# Patient Record
Sex: Female | Born: 1970 | Race: White | Hispanic: No | Marital: Single | Smoking: Never smoker
Health system: Southern US, Community
[De-identification: ages and names within clinical notes are randomized; demographics above are authoritative.]

---

## 2015-09-22 ENCOUNTER — Ambulatory Visit
Admission: EM | Admit: 2015-09-22 | Discharge: 2015-09-22 | Discharge status: Absent without leave | Payer: BC Managed Care – PPO

## 2017-04-09 ENCOUNTER — Other Ambulatory Visit: Payer: Self-pay | Admitting: Pediatrics

## 2017-04-09 DIAGNOSIS — Z1231 Encounter for screening mammogram for malignant neoplasm of breast: Secondary | ICD-10-CM

## 2017-05-01 ENCOUNTER — Encounter: Payer: Self-pay | Admitting: Radiology

## 2017-05-01 ENCOUNTER — Ambulatory Visit
Admission: RE | Admit: 2017-05-01 | Discharge: 2017-05-01 | Disposition: A | Discharge status: Home / Self Care | Payer: BC Managed Care – PPO | Source: Ambulatory Visit | Attending: Pediatrics | Admitting: Pediatrics

## 2017-05-01 DIAGNOSIS — Z1231 Encounter for screening mammogram for malignant neoplasm of breast: Secondary | ICD-10-CM | POA: Diagnosis present

## 2017-05-06 ENCOUNTER — Inpatient Hospital Stay
Admission: RE | Admit: 2017-05-06 | Discharge: 2017-05-06 | Disposition: A | Discharge status: Home / Self Care | Payer: Self-pay | Source: Ambulatory Visit | Attending: *Deleted | Admitting: *Deleted

## 2017-05-06 ENCOUNTER — Other Ambulatory Visit: Payer: Self-pay | Admitting: *Deleted

## 2017-05-06 DIAGNOSIS — Z9289 Personal history of other medical treatment: Secondary | ICD-10-CM

## 2018-06-10 ENCOUNTER — Other Ambulatory Visit: Payer: Self-pay | Admitting: Pediatrics

## 2018-06-10 DIAGNOSIS — Z1231 Encounter for screening mammogram for malignant neoplasm of breast: Secondary | ICD-10-CM

## 2018-07-03 ENCOUNTER — Ambulatory Visit
Admission: RE | Admit: 2018-07-03 | Discharge: 2018-07-03 | Disposition: A | Discharge status: Home / Self Care | Payer: BC Managed Care – PPO | Source: Ambulatory Visit | Attending: Pediatrics | Admitting: Pediatrics

## 2018-07-03 ENCOUNTER — Encounter (INDEPENDENT_AMBULATORY_CARE_PROVIDER_SITE_OTHER): Payer: Self-pay

## 2018-07-03 DIAGNOSIS — Z1231 Encounter for screening mammogram for malignant neoplasm of breast: Secondary | ICD-10-CM | POA: Diagnosis not present

## 2019-05-01 ENCOUNTER — Emergency Department
Admission: EM | Admit: 2019-05-01 | Discharge: 2019-05-01 | Disposition: A | Discharge status: Home / Self Care | Payer: BC Managed Care – PPO | Attending: Emergency Medicine | Admitting: Emergency Medicine

## 2019-05-01 ENCOUNTER — Other Ambulatory Visit: Payer: Self-pay

## 2019-05-01 ENCOUNTER — Emergency Department: Payer: BC Managed Care – PPO

## 2019-05-01 ENCOUNTER — Encounter: Payer: Self-pay | Admitting: Emergency Medicine

## 2019-05-01 DIAGNOSIS — W01198A Fall on same level from slipping, tripping and stumbling with subsequent striking against other object, initial encounter: Secondary | ICD-10-CM | POA: Diagnosis not present

## 2019-05-01 DIAGNOSIS — S0181XA Laceration without foreign body of other part of head, initial encounter: Secondary | ICD-10-CM | POA: Insufficient documentation

## 2019-05-01 DIAGNOSIS — Y998 Other external cause status: Secondary | ICD-10-CM | POA: Insufficient documentation

## 2019-05-01 DIAGNOSIS — Y92018 Other place in single-family (private) house as the place of occurrence of the external cause: Secondary | ICD-10-CM | POA: Insufficient documentation

## 2019-05-01 DIAGNOSIS — S0990XA Unspecified injury of head, initial encounter: Secondary | ICD-10-CM | POA: Diagnosis present

## 2019-05-01 DIAGNOSIS — Y9389 Activity, other specified: Secondary | ICD-10-CM | POA: Insufficient documentation

## 2019-05-01 MED ORDER — LIDOCAINE HCL (PF) 1 % IJ SOLN
INTRAMUSCULAR | Status: AC
  Filled 2019-05-01: qty 5

## 2019-05-01 MED ORDER — CEPHALEXIN 500 MG PO CAPS: 500.0000 mg | ORAL_CAPSULE | Freq: Three times a day (TID) | ORAL | 0 refills | Status: AC

## 2019-05-01 MED ORDER — LIDOCAINE HCL 1 % IJ SOLN: 5.0000 mL | Freq: Once | INTRAMUSCULAR | Status: DC

## 2019-05-01 NOTE — ED Triage Notes (Signed)
Patient ambulatory to triage with complaints of left lower chin laceration that occurred at approx 2000 today.  Pt first presented at urgent care and "they said it was too deep and sent me here" area is bandaged and bleeding controlled.  Pt denies taking medications at time before coming to ED.  And reports last tetanus shot over 5 years prior.    Speaking in complete coherent sentences. No acute breathing distress noted.

## 2019-05-01 NOTE — ED Provider Notes (Signed)
[facility] Department Provider Note  ____________________________________________  Time seen: Approximately 11:09 PM  I have reviewed the triage vital signs and the nursing notes.   HISTORY  Chief Complaint Facial Laceration    HPI Sara Guzman is a 48 y.o. female presents to the emergency department with a 4 cm laceration along the chin.  Patient reports that she was readying her home to have guests when she tripped and fell against a metal ornament on her porch.  She denies losing consciousness.  She denies blurry vision, nausea, disorientation or confusion.  She has been able to ambulate since incident occurred.  No numbness or tingling in the upper and lower extremities.  No chest pain, chest tightness, shortness of breath or abdominal pain. She denies neck pain. No alleviating measures were attempted prior to presenting to the emergency department.         History reviewed. No pertinent past medical history.  There are no active problems to display for this patient.   Past Surgical History:  Procedure Laterality Date  . CESAREAN SECTION      Prior to Admission medications   Not on File    Allergies Patient has no known allergies.  Family History  Problem Relation Age of Onset  . Breast cancer Neg Hx     Social History Social History   Tobacco Use  . Smoking status: Never Smoker  . Smokeless tobacco: Never Used  Substance Use Topics  . Alcohol use: Yes    Frequency: Never  . Drug use: Never     Review of Systems  Constitutional: No fever/chills Eyes: No visual changes. No discharge ENT: No upper respiratory complaints. Cardiovascular: no chest pain. Respiratory: no cough. No SOB. Gastrointestinal: No abdominal pain.  No nausea, no vomiting.  No diarrhea.  No constipation. Musculoskeletal: Negative for musculoskeletal pain. Skin: Patient has facial laceration.  Neurological: Negative for headaches, focal weakness  or numbness.   ____________________________________________   PHYSICAL EXAM:  VITAL SIGNS: ED Triage Vitals [05/01/19 2039]  Enc Vitals Group     BP 136/85     Pulse Rate 74     Resp 18     Temp 98.7 F (37.1 C)     Temp Source Oral     SpO2 100 %     Weight 150 lb (68 kg)     Height  (1.651 m)     Head Circumference      Peak Flow      Pain Score 0     Pain Loc      Pain Edu?      Excl. in GC?      Constitutional: Alert and oriented. Well appearing and in no acute distress. Eyes: Conjunctivae are normal. PERRL. EOMI. Head: Atraumatic. ENT:      Nose: No congestion/rhinnorhea.      Mouth/Throat: Mucous membranes are moist.  Neck: No stridor.  No cervical spine tenderness to palpation. Cardiovascular: Normal rate, regular rhythm. Normal S1 and S2.  Good peripheral circulation. Respiratory: Normal respiratory effort without tachypnea or retractions. Lungs CTAB. Good air entry to the bases with no decreased or absent breath sounds. Gastrointestinal: Bowel sounds 4 quadrants. Soft and nontender to palpation. No guarding or rigidity. No palpable masses. No distention. No CVA tenderness. Musculoskeletal: Full range of motion to all extremities. No gross deformities appreciated. Neurologic:  Normal speech and language. No gross focal neurologic deficits are appreciated.  Skin: Patient has 4 cm, linear laceration along  chin approximately 1 cm deep. Psychiatric: Mood and affect are normal. Speech and behavior are normal. Patient exhibits appropriate insight and judgement.   ____________________________________________   LABS (all labs ordered are listed, but only abnormal results are displayed)  Labs Reviewed - No data to display ____________________________________________  EKG   ____________________________________________  RADIOLOGY I personally viewed and evaluated these images as part of my medical decision making, as well as reviewing the written report  by the radiologist.  Ct Head Wo Contrast  Result Date: 05/01/2019 CLINICAL DATA:  Fall EXAM: CT HEAD WITHOUT CONTRAST CT MAXILLOFACIAL WITHOUT CONTRAST TECHNIQUE: Multidetector CT imaging of the head and maxillofacial structures were performed using the standard protocol without intravenous contrast. Multiplanar CT image reconstructions of the maxillofacial structures were also generated. COMPARISON:  None. FINDINGS: CT HEAD FINDINGS Brain: No evidence of acute infarction, hemorrhage, hydrocephalus, extra-axial collection or mass lesion/mass effect. Vascular: No hyperdense vessel or unexpected calcification. Skull: Normal. Negative for fracture or focal lesion. Other: Incidental note of low-lying cerebellar tonsils, extending approximately 5 mm below the foramen magnum. CT MAXILLOFACIAL FINDINGS Osseous: No fracture or mandibular dislocation. No destructive process. Orbits: Negative. No traumatic or inflammatory finding. Sinuses: Clear. Soft tissues: Soft tissue laceration of the left lip and chin. IMPRESSION: 1.  No acute intracranial pathology. 2. Incidental note of low-lying cerebellar tonsils, extending approximately 5 mm below the foramen magnum. 3.  No displaced fracture or dislocation of the facial bones. 4.  Soft tissue laceration of the left lip and chin. Electronically Signed   By: Lauralyn PrimesAlex  Bibbey M.D.   On: 05/01/2019 21:27   Ct Maxillofacial Wo Contrast  Result Date: 05/01/2019 CLINICAL DATA:  Fall EXAM: CT HEAD WITHOUT CONTRAST CT MAXILLOFACIAL WITHOUT CONTRAST TECHNIQUE: Multidetector CT imaging of the head and maxillofacial structures were performed using the standard protocol without intravenous contrast. Multiplanar CT image reconstructions of the maxillofacial structures were also generated. COMPARISON:  None. FINDINGS: CT HEAD FINDINGS Brain: No evidence of acute infarction, hemorrhage, hydrocephalus, extra-axial collection or mass lesion/mass effect. Vascular: No hyperdense vessel or  unexpected calcification. Skull: Normal. Negative for fracture or focal lesion. Other: Incidental note of low-lying cerebellar tonsils, extending approximately 5 mm below the foramen magnum. CT MAXILLOFACIAL FINDINGS Osseous: No fracture or mandibular dislocation. No destructive process. Orbits: Negative. No traumatic or inflammatory finding. Sinuses: Clear. Soft tissues: Soft tissue laceration of the left lip and chin. IMPRESSION: 1.  No acute intracranial pathology. 2. Incidental note of low-lying cerebellar tonsils, extending approximately 5 mm below the foramen magnum. 3.  No displaced fracture or dislocation of the facial bones. 4.  Soft tissue laceration of the left lip and chin. Electronically Signed   By: Lauralyn PrimesAlex  Bibbey M.D.   On: 05/01/2019 21:27    ____________________________________________    PROCEDURES  Procedure(s) performed:    Marland Kitchen.Marland Kitchen.Laceration Repair  Date/Time: 05/01/2019 11:13 PM Performed by: Orvil FeilWoods, Brandonn Capelli M, PA-C Authorized by: Orvil FeilWoods, Dempsey Ahonen M, PA-C   Consent:    Consent obtained:  Verbal   Consent given by:  Patient   Risks discussed:  Infection, pain, retained foreign body, poor cosmetic result and poor wound healing Anesthesia (see MAR for exact dosages):    Anesthesia method:  Local infiltration   Local anesthetic:  Lidocaine 1% w/o epi Laceration details:    Location:  Face   Face location:  Chin   Length (cm):  4   Depth (mm):  10 Repair type:    Repair type:  Simple Pre-procedure details:    Preparation:  Patient was prepped and draped in usual sterile fashion Exploration:    Hemostasis achieved with:  Direct pressure   Wound exploration: entire depth of wound probed and visualized     Contaminated: no   Treatment:    Area cleansed with:  Saline and Betadine   Amount of cleaning:  Extensive   Irrigation solution:  Sterile saline   Irrigation volume:  20 cc   Irrigation method:  Syringe   Visualized foreign bodies/material removed: yes   Skin repair:     Repair method:  Sutures   Suture size:  6-0   Suture technique:  Simple interrupted   Number of sutures:  11 Approximation:    Approximation:  Close Post-procedure details:    Dressing:  Sterile dressing   Patient tolerance of procedure:  Tolerated well, no immediate complications  .Marland KitchenLaceration Repair  Date/Time: 05/01/2019 11:23 PM Performed by: Orvil Feil, PA-C Authorized by: Orvil Feil, PA-C   Consent:    Consent obtained:  Verbal   Consent given by:  Patient   Risks discussed:  Infection, pain, retained foreign body, poor cosmetic result and poor wound healing Anesthesia (see MAR for exact dosages):    Anesthesia method:  Local infiltration   Local anesthetic:  Lidocaine 1% w/o epi Laceration details:    Location:  Face   Face location:  Chin   Length (cm):  4   Depth (mm):  10 Repair type:    Repair type:  Simple Exploration:    Hemostasis achieved with:  Direct pressure   Wound exploration: entire depth of wound probed and visualized     Contaminated: no   Treatment:    Area cleansed with:  Saline   Amount of cleaning:  Extensive   Irrigation solution:  Sterile saline   Visualized foreign bodies/material removed: no   Skin repair:    Repair method:  Sutures   Suture size:  5-0   Suture material:  Fast-absorbing gut   Suture technique:  Subcuticular   Number of sutures:  6 Approximation:    Approximation:  Close Post-procedure details:    Dressing:  Sterile dressing   Patient tolerance of procedure:  Tolerated well, no immediate complications      Medications  lidocaine (XYLOCAINE) 1 % (with pres) injection 5 mL (has no administration in time range)  lidocaine (PF) (XYLOCAINE) 1 % injection (has no administration in time range)     ____________________________________________   INITIAL IMPRESSION / ASSESSMENT AND PLAN / ED COURSE  Pertinent labs & imaging results that were available during my care of the patient were reviewed by me  and considered in my medical decision making (see chart for details).  Review of the [place] CSRS was performed in accordance of the [facility] prior to dispensing any controlled drugs.           Assessment and Plan: Facial laceration 48 year old female presents to the emergency department with a 4 cm laceration at chin.  Vital signs were reassuring at triage.  Neuro exam was appropriate for age.  CTs of the head and face revealed no evidence of intracranial bleed, skull fracture or facial fracture.  Laceration repair occurred in the emergency department without complication.  Patient was advised to have external sutures removed by primary care in 5 days.  She was discharged with Keflex.  She declined prescriptions for pain and discharge.  All patient questions were answered.    ____________________________________________  FINAL CLINICAL IMPRESSION(S) / ED DIAGNOSES  Final diagnoses:  Facial laceration, initial encounter      NEW MEDICATIONS STARTED DURING THIS VISIT:  ED Discharge Orders    None          This chart was dictated using voice recognition software/Dragon. Despite best efforts to proofread, errors can occur which can change the meaning. Any change was purely unintentional.    Lannie Fields, PA-C 05/01/19 2325    Vanessa Allisonia, MD 05/03/19 [phone]

## 2019-05-01 NOTE — ED Notes (Signed)
Lip lac appears as avulsion, measuring 3 cm in length

## 2020-11-17 ENCOUNTER — Other Ambulatory Visit: Payer: Self-pay | Admitting: Family

## 2020-11-17 DIAGNOSIS — Z1231 Encounter for screening mammogram for malignant neoplasm of breast: Secondary | ICD-10-CM

## 2020-12-07 ENCOUNTER — Other Ambulatory Visit: Payer: Self-pay

## 2020-12-07 ENCOUNTER — Ambulatory Visit
Admission: RE | Admit: 2020-12-07 | Discharge: 2020-12-07 | Disposition: A | Discharge status: Home / Self Care | Payer: BC Managed Care – PPO | Source: Ambulatory Visit | Attending: Family | Admitting: Family

## 2020-12-07 DIAGNOSIS — Z1231 Encounter for screening mammogram for malignant neoplasm of breast: Secondary | ICD-10-CM | POA: Diagnosis not present

## 2021-02-09 ENCOUNTER — Encounter: Payer: Self-pay | Admitting: Emergency Medicine

## 2021-02-09 ENCOUNTER — Other Ambulatory Visit: Payer: Self-pay

## 2021-02-09 ENCOUNTER — Emergency Department: Payer: BC Managed Care – PPO

## 2021-02-09 ENCOUNTER — Emergency Department
Admission: EM | Admit: 2021-02-09 | Discharge: 2021-02-09 | Disposition: A | Discharge status: Home / Self Care | Payer: BC Managed Care – PPO | Attending: Emergency Medicine | Admitting: Emergency Medicine

## 2021-02-09 DIAGNOSIS — R82998 Other abnormal findings in urine: Secondary | ICD-10-CM | POA: Diagnosis not present

## 2021-02-09 DIAGNOSIS — R55 Syncope and collapse: Secondary | ICD-10-CM

## 2021-02-09 DIAGNOSIS — S0990XA Unspecified injury of head, initial encounter: Secondary | ICD-10-CM | POA: Diagnosis present

## 2021-02-09 DIAGNOSIS — S0181XA Laceration without foreign body of other part of head, initial encounter: Secondary | ICD-10-CM | POA: Diagnosis not present

## 2021-02-09 DIAGNOSIS — W01198A Fall on same level from slipping, tripping and stumbling with subsequent striking against other object, initial encounter: Secondary | ICD-10-CM | POA: Insufficient documentation

## 2021-02-09 LAB — BASIC METABOLIC PANEL
Anion gap: 9 (ref 5–15)
BUN: 14 mg/dL (ref 6–20)
CO2: 23 mmol/L (ref 22–32)
Calcium: 8.3 mg/dL — ABNORMAL LOW (ref 8.9–10.3)
Chloride: 105 mmol/L (ref 98–111)
Creatinine, Ser: 0.9 mg/dL (ref 0.44–1.00)
GFR, Estimated: >60 mL/min (ref >60)
Glucose, Bld: 96 mg/dL (ref 70–99)
Potassium: 4.1 mmol/L (ref 3.5–5.1)
Sodium: 137 mmol/L (ref 135–145)

## 2021-02-09 LAB — CBC
HCT: 40.9 % (ref 36.0–46.0)
Hemoglobin: 13.8 g/dL (ref 12.0–15.0)
MCH: 31.2 pg (ref 26.0–34.0)
MCHC: 33.7 g/dL (ref 30.0–36.0)
MCV: 92.5 fL (ref 80.0–100.0)
Platelets: 343 10*3/uL (ref 150–400)
RBC: 4.42 MIL/uL (ref 3.87–5.11)
RDW: 13.4 % (ref 11.5–15.5)
WBC: 7 10*3/uL (ref 4.0–10.5)
nRBC: 0 % (ref 0.0–0.2)

## 2021-02-09 LAB — URINALYSIS, COMPLETE (UACMP) WITH MICROSCOPIC
Bilirubin Urine: NEGATIVE
Glucose, UA: NEGATIVE mg/dL
Ketones, ur: NEGATIVE mg/dL
Leukocytes,Ua: NEGATIVE
Nitrite: NEGATIVE
Protein, ur: NEGATIVE mg/dL
Specific Gravity, Urine: 1.023 (ref 1.005–1.030)
pH: 6 (ref 5.0–8.0)

## 2021-02-09 LAB — POC URINE PREG, ED: Preg Test, Ur: NEGATIVE

## 2021-02-09 NOTE — ED Notes (Signed)
Pt presents to ED with complaints of head and positive LOC last night after dinner. Pt reports headache while making dinner then was watching movie when she began to feel nauseous, then stood up that resulted in passing out. Pt reports hitting head and landing on knees. Pt at this time states that she feels fine, besides feeling a little "whoosy". Pt at this time in no apparent distress.

## 2021-02-09 NOTE — Discharge Instructions (Addendum)
Get help right away if: You develop severe swelling around your wound. You have pus or a bad smell coming from your wound. Your pain is severe and suddenly gets worse. You develop painful lumps near your wound or anywhere on your body. You have a red streak going away from your wound.

## 2021-02-09 NOTE — ED Triage Notes (Signed)
Pt comes into the ED via POV c/o syncopal episode yesterday where she fell and hit her head.  Pt states that she had a headache all day yesterday and finally was feeling better so she got up to cook dinner.  She felt like she was going to have an episode of emesis when she stood, and then she doesn't remember anything after that.  Pt does present with small laceration to the forehead.  Pt denies any blood thinner use.  Pt currently neurologically intact and is ambulatory to triage.

## 2021-02-09 NOTE — ED Provider Notes (Signed)
[facility] Emergency Department Provider Note   ____________________________________________   Event Date/Time   First MD Initiated Contact with Patient 02/09/21 1155     (approximate)  I have reviewed the triage vital signs and the nursing notes.   HISTORY  Chief Complaint Loss of Consciousness    HPI Sara Guzman is a 50 y.o. female reports no major past medical history.  She reports yesterday she mowed the lawn, then in the evening she was sitting on the couch and she started to feel little bit nauseated.  She stood up and when she did she passed out.  She reports she fell forward, and struck her left forehead on a weight that she uses for working out that was on the ground.  It bled a fair amount but the bleeding did stop and she has a small cut over the left forehead.  She is up-to-date on her tetanus shot.  It is already started to heal over  She reports a mild headache.  She does report a history of frequent sinus headaches, usually associated with doing things like allergies or mowing the lawn and reports that the headache is not unusual but defect that she passed out is.  She does have a history of low blood pressure but has never passed out before  There is no associated chest pain no trouble breathing.  No racing heart.  She is been in her normal health.  Not pregnant.  Event occurred last evening around 10 PM.  She called her doctor's office this morning and they referred her to the ER for further evaluation.  She reports she is been doing peripherally fine this morning without any concerns or difficulties.  Just reports a very mild headache.  She has not any fevers no neck pain.  No other injuries.  She was able to get herself up off the floor clean up the floor, and rest last night.  History reviewed. No pertinent past medical history.  There are no problems to display for this patient.   Past Surgical History:  Procedure Laterality  Date   CESAREAN SECTION      Prior to Admission medications   Not on File    Allergies Patient has no known allergies.  Family History  Problem Relation Age of Onset   Breast cancer Neg Hx     Social History Social History   Tobacco Use   Smoking status: Never   Smokeless tobacco: Never  Substance Use Topics   Alcohol use: Yes   Drug use: Never    Review of Systems Constitutional: Feels fine now, no recent illnesses.   Eyes: No visual changes. ENT:  No neck pain Cardiovascular: Denies chest pain. Respiratory: Denies shortness of breath. Gastrointestinal: No abdominal pain.  Felt nauseated right before she stood up and passed out but that has resolved.  No vomiting. Genitourinary: Negative for dysuria. Musculoskeletal: Negative for back pain. Skin: Negative for rash. Neurological: Negative for areas of focal weakness or numbness.  Takes no blood thinners.  No history of bleeding disorder.  ____________________________________________   PHYSICAL EXAM:  VITAL SIGNS: ED Triage Vitals  Enc Vitals Group     BP 02/09/21 0921 114/75     Pulse Rate 02/09/21 0921 63     Resp 02/09/21 0921 18     Temp 02/09/21 0921 98.2 F (36.8 C)     Temp Source 02/09/21 0921 Oral     SpO2 02/09/21 0921 99 %  Weight 02/09/21 0920 149 lb 14.6 oz (68 kg)     Height 02/09/21 0920 5\' 5"  (1.651 m)     Head Circumference --      Peak Flow --      Pain Score 02/09/21 0920 0     Pain Loc --      Pain Edu? --      Excl. in GC? --     Constitutional: Alert and oriented. Well appearing and in no acute distress.  Ambulatory from her room back and forth to the physician station without difficulty.  She is very pleasant.  Normal gait. Eyes: Conjunctivae are normal. Head: Atraumatic except for a very small left frontal hematoma with minimal swelling, and centrally about a 1 cm well approximated laceration that has now closed by secondary intention.  There is no surrounding erythema  drainage or evidence of infection. Nose: No congestion/rhinnorhea. Mouth/Throat: Mucous membranes are moist. Neck: No stridor.  No cervical or thoracic tenderness. Cardiovascular: Normal rate, regular rhythm. Grossly normal heart sounds.  Good peripheral circulation. Respiratory: Normal respiratory effort.  No retractions. Lungs CTAB. Gastrointestinal: Soft and nontender. No distention. Musculoskeletal: No lower extremity tenderness nor edema. Neurologic:  Normal speech and language. No gross focal neurologic deficits are appreciated.  Normal extraocular movements.  No pronator drift in any extremity.  5 out of 5 strength in all extremities.  No ataxia.  Normal facial smile and clear speech. Skin:  Skin is warm, dry and intact. No rash noted. Psychiatric: Mood and affect are normal. Speech and behavior are normal.  ____________________________________________   LABS (all labs ordered are listed, but only abnormal results are displayed)  Labs Reviewed  BASIC METABOLIC PANEL - Abnormal; Notable for the following components:      Result Value   Calcium 8.3 (*)    All other components within normal limits  URINALYSIS, COMPLETE (UACMP) WITH MICROSCOPIC - Abnormal; Notable for the following components:   Color, Urine YELLOW (*)    APPearance HAZY (*)    Hgb urine dipstick SMALL (*)    Bacteria, UA RARE (*)    All other components within normal limits  URINE CULTURE  CBC  POC URINE PREG, ED   ____________________________________________  EKG  Reviewed inter by me at 920 Heart rate 55 QRS 80 QTc 400 Sinus bradycardia, otherwise normal ____________________________________________  RADIOLOGY  CT HEAD WO CONTRAST (04/11/21)  Result Date: 02/09/2021 CLINICAL DATA:  Fall striking left side of the forehead yesterday. Nausea. Headache. EXAM: CT HEAD WITHOUT CONTRAST TECHNIQUE: Contiguous axial images were obtained from the base of the skull through the vertex without intravenous contrast.  COMPARISON:  05/01/2019 FINDINGS: Brain: The brainstem, cerebellum, cerebral peduncles, thalami, basal ganglia, basilar cisterns, and ventricular system appear within normal limits. No intracranial hemorrhage, mass lesion, or acute CVA. Vascular: Unremarkable Skull: Unremarkable Sinuses/Orbits: Chronic left sphenoid sinusitis. Other: Mild soft tissue swelling/scalp hematoma along the left forehead, image 6 series 2. IMPRESSION: 1. Mild soft tissue swelling/scalp hematoma along the left forehead. 2. No acute intracranial abnormality. 3. Chronic left sphenoid sinusitis. Electronically Signed   By: 05/03/2019 M.D.   On: 02/09/2021 13:12     Discussed imaging including findings with the patient ____________________________________________   PROCEDURES  Procedure(s) performed: None  Procedures  Critical Care performed: No  ____________________________________________   INITIAL IMPRESSION / ASSESSMENT AND PLAN / ED COURSE  Pertinent labs & imaging results that were available during my care of the patient were reviewed by me and considered in  my medical decision making (see chart for details).   Patient has episode of syncope.  Also associates a mild headache but reports that to be typical and not unusual.  She did cut the grass yesterday, and then in the evening felt slightly nauseated and when she stood up became lightheaded and passed out.  No obvious preceding symptoms to suggest acute cardiac pulmonary vascular or other etiology.  She is resting comfortably now with normal hemodynamics, event occurred last night.  Ambulatory without distress.  Small cut over the left forehead which will be allowed to heal by secondary intent as it is already closed.  Discussed signs and symptoms of potential infection to monitor for.  Patient up-to-date on tetanus.  Resting comfortably without distress.  Reassuring insuring labs EKG CBC and metabolic panel all without acute abnormalities  noted  ----------------------------------------- 1:34 PM on 02/09/2021 ----------------------------------------- Head CT performed to evaluate and exclude intracranial injury, unlikely subarachnoid hemorrhage etc.  Return precautions and treatment recommendations and follow-up discussed with the patient who is agreeable with the plan.   Patient able to ambulate out of the ER without difficulty.  Very pleasant.      ____________________________________________   FINAL CLINICAL IMPRESSION(S) / ED DIAGNOSES  Final diagnoses:  Syncope and collapse  Forehead laceration, initial encounter        Note:  This document was prepared using Dragon voice recognition software and may include unintentional dictation errors       Sharyn Creamer, MD 02/09/21 1334

## 2021-02-10 LAB — URINE CULTURE

## 2022-05-14 IMAGING — MG MM DIGITAL SCREENING BILAT W/ TOMO AND CAD
8 series · 9 of 24 positions shown · non-contrast
Comparison: Previous exam(s).

CLINICAL DATA: Screening.

EXAM:
DIGITAL SCREENING BILATERAL MAMMOGRAM WITH TOMOSYNTHESIS AND CAD
TECHNIQUE: Bilateral screening digital craniocaudal and mediolateral oblique
mammograms were obtained. Bilateral screening digital breast
tomosynthesis was performed. The images were evaluated with
computer-aided detection.

[L CC synth-2D]
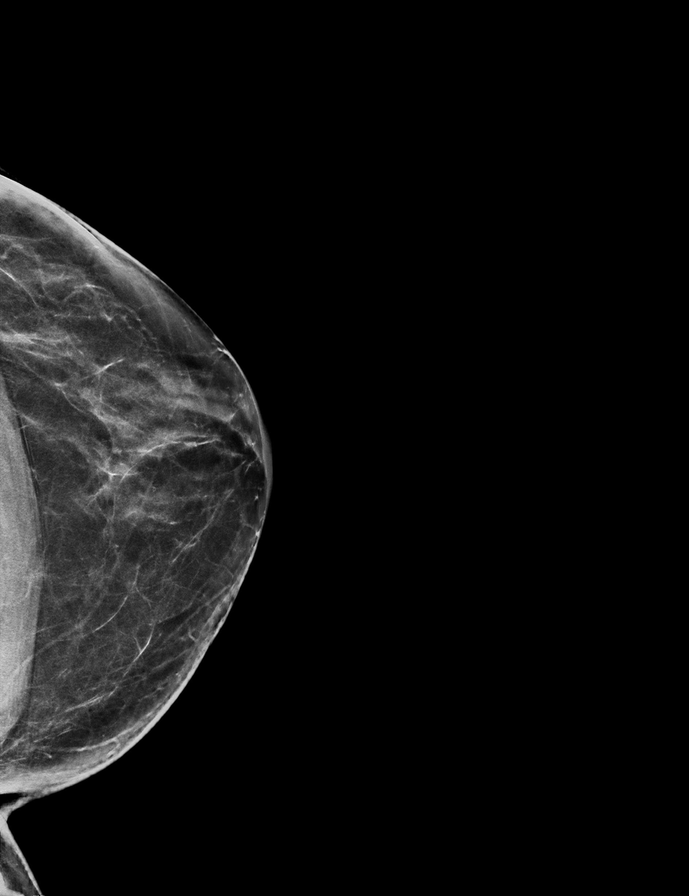

[R MLO synth-2D]
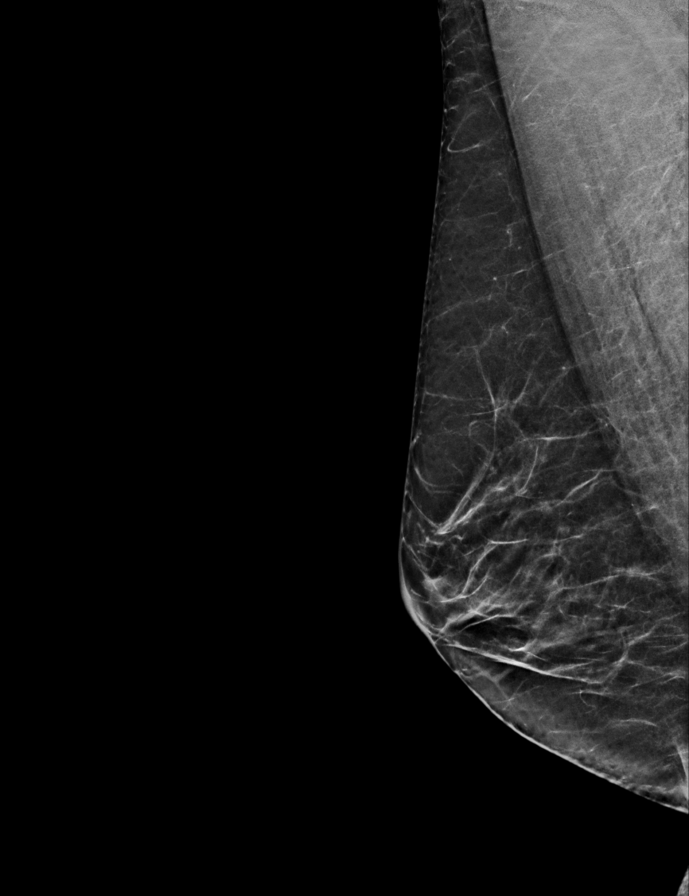

[R CC synth-2D]
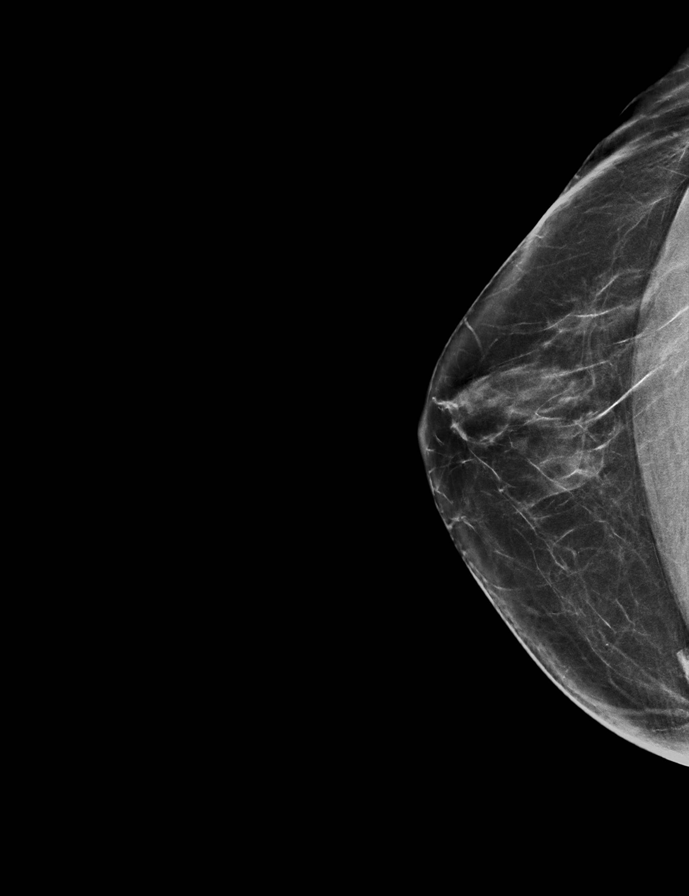

[L MLO synth-2D]
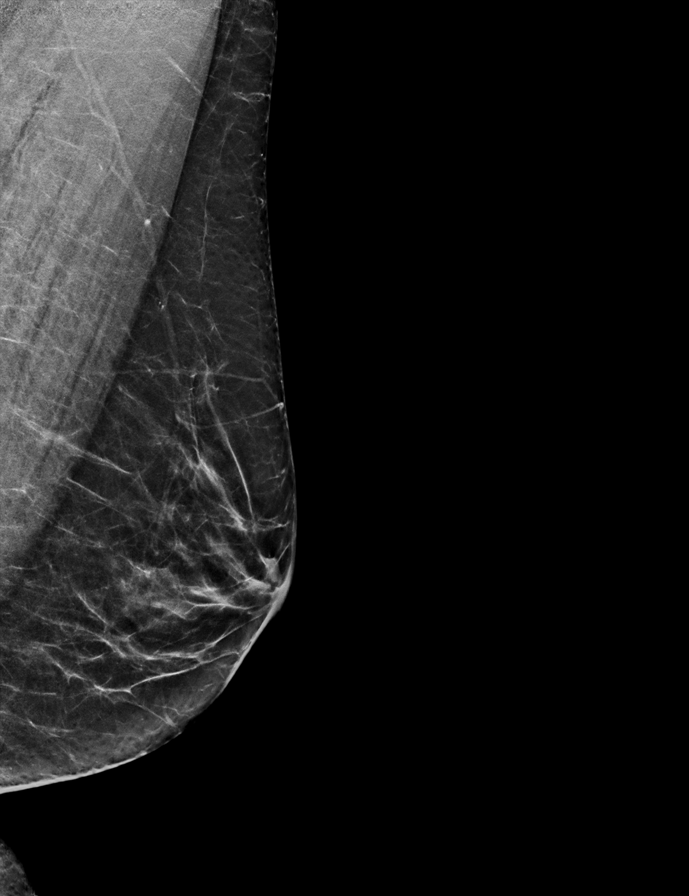

[R MLO tomo · 2 of 52 frames shown]
[frame 17/52]
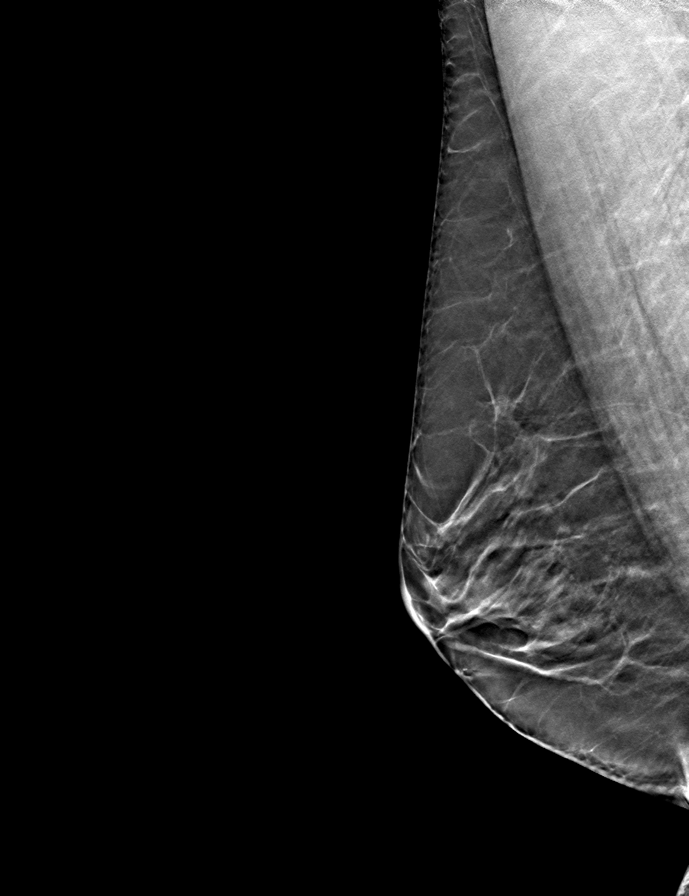
[frame 27/52]
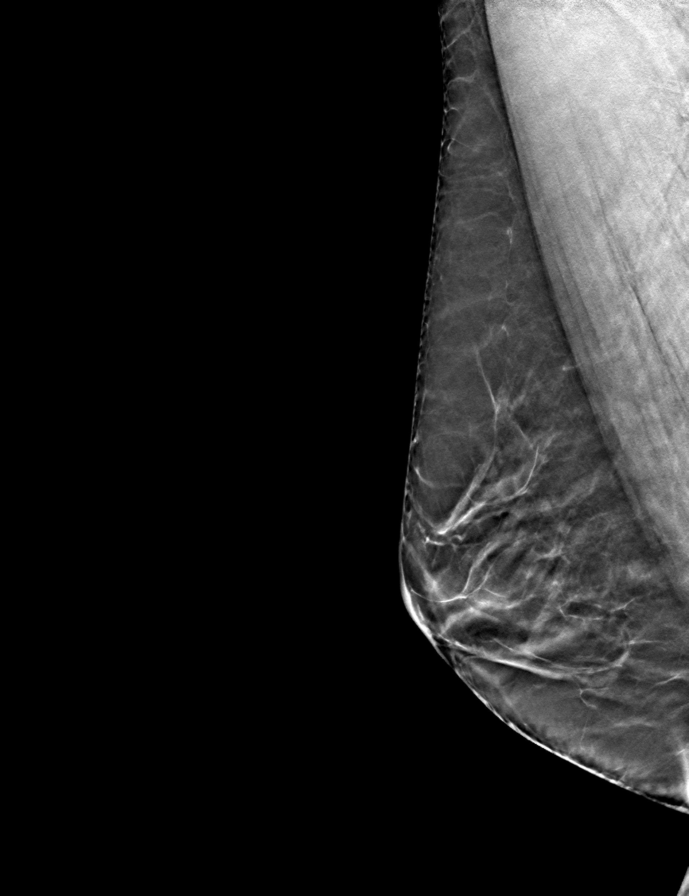

[R CC tomo · tomo slice 32/63.0]
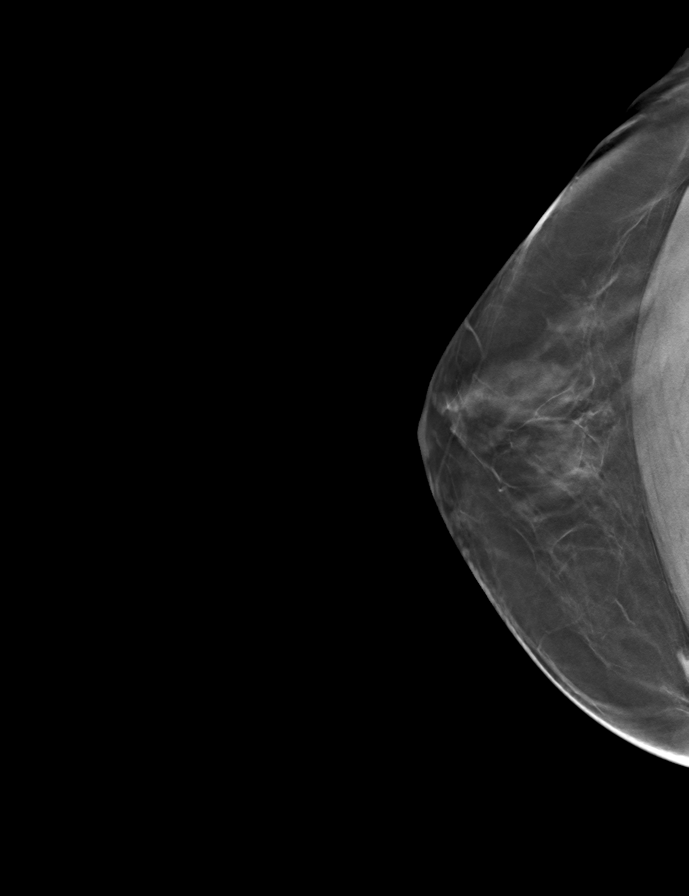

[L MLO tomo · tomo slice 28/55.0]
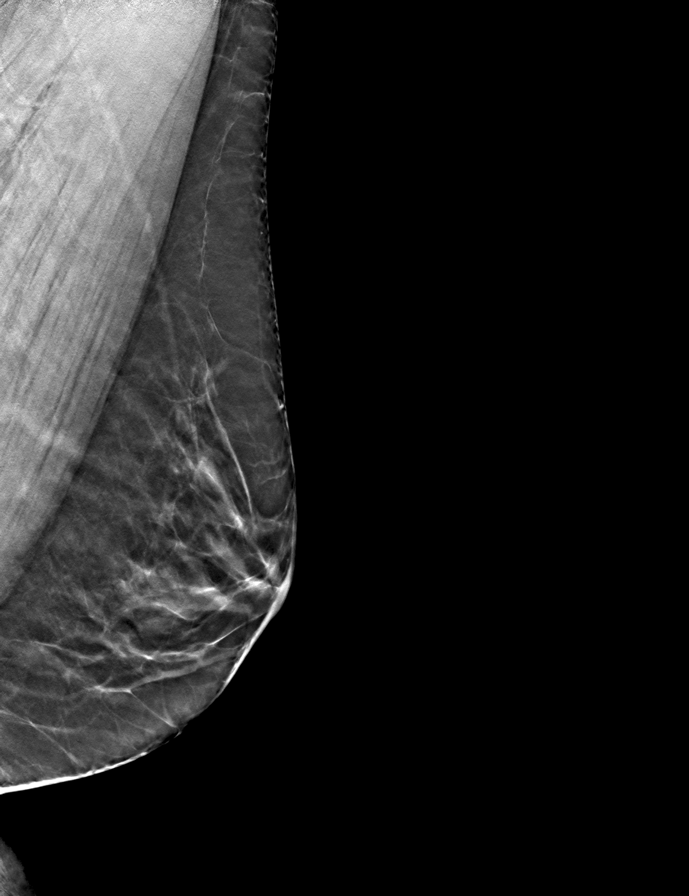

[L CC tomo · tomo slice 33/64.0]
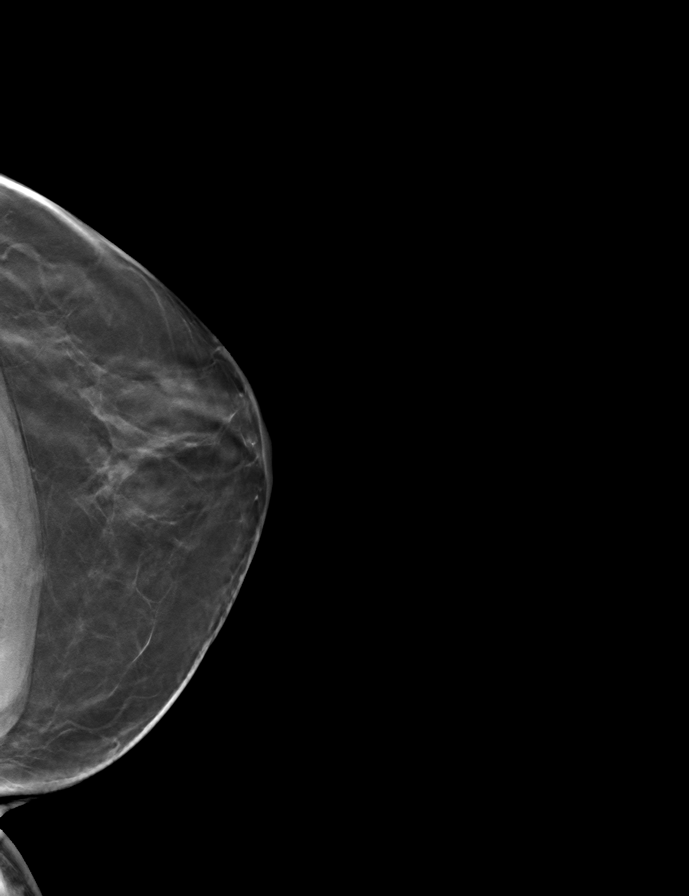

[9 of 24 positions shown; findings below may reference images not displayed]

ACR Breast Density Category c: The breast tissue is heterogeneously
dense, which may obscure small masses.
FINDINGS: There are no findings suspicious for malignancy. The images were
evaluated with computer-aided detection.
IMPRESSION: No mammographic evidence of malignancy. A result letter of this
screening mammogram will be mailed directly to the patient.

RECOMMENDATION:
Screening mammogram in one year. (Code:T4-5-GWO)

BI-RADS CATEGORY  1: Negative.

## 2022-11-20 ENCOUNTER — Other Ambulatory Visit: Payer: Self-pay | Admitting: Family

## 2022-11-20 DIAGNOSIS — Z1231 Encounter for screening mammogram for malignant neoplasm of breast: Secondary | ICD-10-CM

## 2022-12-12 ENCOUNTER — Ambulatory Visit: Payer: BC Managed Care – PPO

## 2022-12-17 ENCOUNTER — Ambulatory Visit
Admission: RE | Admit: 2022-12-17 | Discharge: 2022-12-17 | Disposition: A | Discharge status: Home / Self Care | Payer: BC Managed Care – PPO | Source: Ambulatory Visit | Attending: Family | Admitting: Family

## 2022-12-17 DIAGNOSIS — Z1231 Encounter for screening mammogram for malignant neoplasm of breast: Secondary | ICD-10-CM | POA: Diagnosis present

## 2023-06-26 ENCOUNTER — Emergency Department: Payer: BC Managed Care – PPO

## 2023-06-26 ENCOUNTER — Other Ambulatory Visit: Payer: Self-pay

## 2023-06-26 ENCOUNTER — Emergency Department
Admission: EM | Admit: 2023-06-26 | Discharge: 2023-06-26 | Disposition: A | Discharge status: Home / Self Care | Payer: BC Managed Care – PPO | Attending: Emergency Medicine | Admitting: Emergency Medicine

## 2023-06-26 DIAGNOSIS — W01198A Fall on same level from slipping, tripping and stumbling with subsequent striking against other object, initial encounter: Secondary | ICD-10-CM | POA: Insufficient documentation

## 2023-06-26 DIAGNOSIS — S0990XA Unspecified injury of head, initial encounter: Secondary | ICD-10-CM | POA: Diagnosis present

## 2023-06-26 DIAGNOSIS — W19XXXA Unspecified fall, initial encounter: Secondary | ICD-10-CM

## 2023-06-26 NOTE — ED Provider Notes (Signed)
[facility] Provider Note    None    (approximate)   History   Fall   HPI  Sara Guzman is a 52 y.o. female with no reported past medical history presents today for evaluation of headache and nausea that occurred after a fall on Sunday.  Patient reports that she tripped over uneven ground and fell and hit the side of her head.  She denies loss of consciousness.  She has not had any nausea or vomiting.  She has not had any trouble walking or speaking.  She is not anticoagulated.  No visual changes.  She denies any other injury sustained.     Physical Exam   Triage Vital Signs: ED Triage Vitals [06/26/23 1223]  Encounter Vitals Group     BP (!) 151/69     Systolic BP Percentile      Diastolic BP Percentile      Pulse Rate 62     Resp 17     Temp 97.9 F (36.6 C)     Temp Source Oral     SpO2 100 %     Weight 149 lb 14.6 oz (68 kg)     Height 5\' 5"  (1.651 m)     Head Circumference      Peak Flow      Pain Score 6     Pain Loc      Pain Education      Exclude from Growth Chart     Most recent vital signs: Vitals:   06/26/23 1223  BP: (!) 151/69  Pulse: 62  Resp: 17  Temp: 97.9 F (36.6 C)  SpO2: 100%    Physical Exam Vitals and nursing note reviewed.  Constitutional:      General: Awake and alert. No acute distress.    Appearance: Normal appearance. The patient is normal weight.  HENT:     Head: Normocephalic and atraumatic.     Mouth: Mucous membranes are moist.  Eyes:     General: PERRL. Normal EOMs        Right eye: No discharge.        Left eye: No discharge.     Conjunctiva/sclera: Conjunctivae normal.  Cardiovascular:     Rate and Rhythm: Normal rate and regular rhythm.     Pulses: Normal pulses.  Pulmonary:     Effort: Pulmonary effort is normal. No respiratory distress.     Breath sounds: Normal breath sounds.  Abdominal:     Abdomen is soft. There is no abdominal tenderness. No rebound or guarding. No  distention. Musculoskeletal:        General: No swelling. Normal range of motion.     Cervical back: Normal range of motion and neck supple. No midline cervical spine tenderness.  Full range of motion of neck.  Negative Spurling test.  Negative Lhermitte sign.  Normal strength and sensation in bilateral upper extremities. Normal grip strength bilaterally.  Normal intrinsic muscle function of the hand bilaterally.  Normal radial pulses bilaterally. Skin:    General: Skin is warm and dry.     Capillary Refill: Capillary refill takes less than 2 seconds.     Findings: No rash.  Neurological:     Mental Status: The patient is awake and alert.   Neurological: GCS 15 alert and oriented x3 Normal speech, no expressive or receptive aphasia or dysarthria Cranial nerves II through XII intact Normal visual fields 5 out of 5 strength in all 4 extremities with  intact sensation throughout No extremity drift Normal finger-to-nose testing, no limb or truncal ataxia    ED Results / Procedures / Treatments   Labs (all labs ordered are listed, but only abnormal results are displayed) Labs Reviewed - No data to display   EKG     RADIOLOGY I independently reviewed and interpreted imaging and agree with radiologists findings.     PROCEDURES:  Critical Care performed:   Procedures   MEDICATIONS ORDERED IN ED: Medications - No data to display   IMPRESSION / MDM / ASSESSMENT AND PLAN / ED COURSE  I reviewed the triage vital signs and the nursing notes.   Differential diagnosis includes, but is not limited to, contusion, concussion, intracranial hemorrhage, cervical spine injury.  Patient is awake and alert, hemodynamically stable and afebrile.  She is neurologically intact.  She has no midline cervical spine tenderness and has full normal range of motion of her neck.  She has normal strength and sensation bilateral upper extremities, normal grip strength, not consistent with central  cord syndrome.  CT head and neck obtained in triage and are overall reassuring.  Upon reevaluation, patient reports that she feels improved, and does not have any other complaints today.  She is ready for discharge home.  We discussed concussion precautions and return precautions.  Patient understands and agrees with plan.  She was discharged in stable condition.   Patient's presentation is most consistent with acute complicated illness / injury requiring diagnostic workup.    FINAL CLINICAL IMPRESSION(S) / ED DIAGNOSES   Final diagnoses:  Injury of head, initial encounter  Fall, initial encounter     Rx / DC Orders   ED Discharge Orders     None        Note:  This document was prepared using Dragon voice recognition software and may include unintentional dictation errors.   Jackelyn Hoehn, PA-C 06/26/23 1514    Corena Herter, MD 06/26/23 1556

## 2023-06-26 NOTE — ED Provider Triage Note (Signed)
Emergency Medicine Provider Triage Evaluation Note  Sara Guzman , a 52 y.o. female  was evaluated in triage.  Pt complains of headache after fall, reports she tripped on uneven sidewalk. No anticoagulation. No vomiting. Normal gait.  Review of Systems  Positive: Headache, nausea Negative: vomiting  Physical Exam  BP (!) 151/69 (BP Location: Right Arm)   Pulse 62   Temp 97.9 F (36.6 C) (Oral)   Resp 17   Ht 5\' 5"  (1.651 m)   Wt 68 kg   SpO2 100%   BMI 24.95 kg/m  Gen:   Awake, no distress   Resp:  Normal effort  MSK:   Moves extremities without difficulty  Other:    Medical Decision Making  Medically screening exam initiated at 12:24 PM.  Appropriate orders placed.  Sara Guzman was informed that the remainder of the evaluation will be completed by another provider, this initial triage assessment does not replace that evaluation, and the importance of remaining in the ED until their evaluation is complete.     Jackelyn Hoehn, PA-C 06/26/23 1225

## 2023-06-26 NOTE — ED Triage Notes (Signed)
Pt c/o of fall Sunday, pt states she hit her head but states she was on vacation and states flew out next day and states she is still having a headache. Pt denies blood thinner use. Pt ambulatory to triage with steady gait. Pt c/o of nausea after fall. Pt is A&Ox4.

## 2023-06-26 NOTE — Discharge Instructions (Signed)
Your CT scans are normal.  Please follow-up with your outpatient provider.  Please return for any new, worsening, or change in symptoms or other concerns.  It was a pleasure caring for you today.

## 2023-08-01 ENCOUNTER — Other Ambulatory Visit: Payer: Self-pay | Admitting: Medical Genetics

## 2024-04-14 ENCOUNTER — Other Ambulatory Visit: Payer: Self-pay | Admitting: Family

## 2024-04-14 DIAGNOSIS — Z1231 Encounter for screening mammogram for malignant neoplasm of breast: Secondary | ICD-10-CM

## 2024-05-07 ENCOUNTER — Other Ambulatory Visit: Payer: Self-pay | Admitting: Medical Genetics

## 2024-05-07 DIAGNOSIS — Z006 Encounter for examination for normal comparison and control in clinical research program: Secondary | ICD-10-CM

## 2024-05-20 ENCOUNTER — Ambulatory Visit
Admission: RE | Admit: 2024-05-20 | Discharge: 2024-05-20 | Disposition: A | Discharge status: Home / Self Care | Payer: Self-pay | Source: Ambulatory Visit | Attending: Family | Admitting: Family

## 2024-05-20 DIAGNOSIS — Z1231 Encounter for screening mammogram for malignant neoplasm of breast: Secondary | ICD-10-CM | POA: Insufficient documentation

## 2024-05-22 ENCOUNTER — Encounter: Payer: Self-pay | Admitting: Family

## 2024-05-25 ENCOUNTER — Other Ambulatory Visit: Payer: Self-pay | Admitting: Family

## 2024-05-25 DIAGNOSIS — R928 Other abnormal and inconclusive findings on diagnostic imaging of breast: Secondary | ICD-10-CM

## 2024-05-26 ENCOUNTER — Other Ambulatory Visit: Payer: Self-pay | Admitting: Family

## 2024-05-26 ENCOUNTER — Inpatient Hospital Stay: Admission: RE | Admit: 2024-05-26 | Discharge: 2024-05-26 | Attending: Family

## 2024-05-26 ENCOUNTER — Ambulatory Visit
Admission: RE | Admit: 2024-05-26 | Discharge: 2024-05-26 | Disposition: A | Discharge status: Home / Self Care | Source: Ambulatory Visit | Attending: Family | Admitting: Family

## 2024-05-26 DIAGNOSIS — R928 Other abnormal and inconclusive findings on diagnostic imaging of breast: Secondary | ICD-10-CM | POA: Insufficient documentation

## 2024-06-17 LAB — GENECONNECT MOLECULAR SCREEN: Genetic Analysis Overall Interpretation: NEGATIVE
